# Patient Record
Sex: Female | Born: 1955 | Race: White | Hispanic: No | Marital: Married | Smoking: Never smoker
Health system: Southern US, Community
[De-identification: ages and names within clinical notes are randomized; demographics above are authoritative.]

## PROBLEM LIST (undated history)

## (undated) DIAGNOSIS — E119 Type 2 diabetes mellitus without complications: Secondary | ICD-10-CM

## (undated) DIAGNOSIS — E7800 Pure hypercholesterolemia, unspecified: Secondary | ICD-10-CM

## (undated) DIAGNOSIS — E079 Disorder of thyroid, unspecified: Secondary | ICD-10-CM

## (undated) HISTORY — PX: THROAT SURGERY: SHX803

## (undated) HISTORY — PX: REPLACEMENT TOTAL KNEE: SUR1224

---

## 1988-11-13 HISTORY — PX: BREAST BIOPSY: SHX20

## 2002-04-21 ENCOUNTER — Ambulatory Visit (HOSPITAL_COMMUNITY): Admission: RE | Admit: 2002-04-21 | Discharge: 2002-04-21 | Payer: Self-pay | Admitting: Gastroenterology

## 2005-02-21 ENCOUNTER — Ambulatory Visit: Payer: Self-pay | Admitting: Family Medicine

## 2006-04-27 ENCOUNTER — Ambulatory Visit: Payer: Self-pay | Admitting: Family Medicine

## 2007-07-04 ENCOUNTER — Ambulatory Visit: Payer: Self-pay | Admitting: Family Medicine

## 2007-11-20 ENCOUNTER — Inpatient Hospital Stay (HOSPITAL_COMMUNITY): Admission: RE | Admit: 2007-11-20 | Discharge: 2007-11-24 | Payer: Self-pay | Admitting: Orthopaedic Surgery

## 2008-09-17 ENCOUNTER — Ambulatory Visit: Payer: Self-pay | Admitting: Family Medicine

## 2009-09-30 ENCOUNTER — Ambulatory Visit: Payer: Self-pay | Admitting: Family Medicine

## 2010-10-11 ENCOUNTER — Ambulatory Visit: Payer: Self-pay | Admitting: Family Medicine

## 2011-01-06 ENCOUNTER — Ambulatory Visit: Payer: Self-pay | Admitting: Internal Medicine

## 2011-03-28 NOTE — Op Note (Signed)
NAMELOUINE, TENPENNY               ACCOUNT NO.:  [id number]   MEDICAL RECORD NO.:  [id number]          PATIENT TYPE:  INP   LOCATION:  2550                         FACILITY:  [facility]   PHYSICIAN:  Mark C. Ophelia Charter, M.D.    DATE OF BIRTH:  October 31, 1956   DATE OF PROCEDURE:  11/20/2007  DATE OF DISCHARGE:                               OPERATIVE REPORT   PREOPERATIVE DIAGNOSIS:  Right knee osteoarthritis.   POSTOPERATIVE DIAGNOSIS:  Right knee osteoarthritis.   PROCEDURE:  Right cemented total knee arthroplasty, computer assist.   SURGEON:  Mark C. Ophelia Charter, M.D.   ANESTHESIA:  GOT plus preoperative femoral nerve block plus Marcaine  local.   DRAINS:  One Hemovac.   SURGEON:  Mark C. Ophelia Charter, M.D.   ASSISTANT:  Wende Neighbors, P.A.-C.   TOURNIQUET TIME:  1 hour 13 minutes at 350.   PROCEDURE IN DETAIL:  After induction of general anesthesia and  orotracheal intubation, preoperative femoral nerve block, proximal thigh  tourniquet with lateral post and heel bump, careful padding, 1015 drape,  the leg was prepped with DuraPrep, usual total knee sheets, drapes,  impervious stockinette, Coban, sterile skin marker and Betadine Vidrape  was applied.  The time out procedure was taken following WHO new  standards.  Ancef was given and was complete prior to the incision.  The  leg was wrapped in an Esmarch, tourniquet inflated to 350 mmHg.  A  midline incision was made, superficial retinaculum was developed, true  retinaculum was divided with the medial parapatellar incisions between  the quad tendon between the medial 1/3 and lateral 2/3.  The patella was  flipped over, cut from facet to facet, removing 10 mm of bone.  The  retractor was placed and bone spurs removed.  There was tricompartmental  degenerative changes, marginal osteophytes, and some meniscal remnants.  The meniscus were resected.  Pins were placed for computer initiation  with two femoral pins inside the incision and then a 15  blade was used  for stab incision in the mid tibia for the two bicortical tibial pins.  Models were made of both the tibia and the femur.  The patient had 3.5  degrees of varus and had an almost 20 degree flexion contracture.  10-11  mm of bone were removed off both sides, posterior spurs were removed off  the femur.  All cuts were checked after cutting using the computer and  sizing was to a #3 and both flexion and extension gaps were within 0.5  mm.  Once keyholes were made in the tibia and all meniscal remnants had  been resected as well as ACL and PCL, pulsatile lavage was used.  The  trials were inserted.  The knee reached full extension.  The collateral  ligaments were balanced and spacer block gave full extension.  After  lavage, the tibia was cemented first followed by the femur, placement of  the 10 mm poly and then the 35 mm patella.  DePuy J&J rotating platform  knee was used, PFC, posterior cruciate substituting.  All excessive  cement was removed. After the cement  had hardened, the tourniquet was  deflated, hemostasis was obtained, and then layered closure.  During the  tibial resection, the oscillating saw caught part of the skin on the  medial aspect at the level of the joint line.  This gave a 1 to 1.5 cm  tear in the skin and, at the time of closure, some 3-0 Vicryl  subcuticulars were placed followed by some nylon sutures.  Subcuticular  on the rest of the incision closure. Postop dressing and a knee  immobilizer.  Instrument count and needle count was correct.      Mark C. Ophelia Charter, M.D.  Electronically Signed     MCY/MEDQ  D:  11/20/2007  T:  11/20/2007  Job:  [id number]

## 2011-03-31 NOTE — Discharge Summary (Signed)
[name], [name]               ACCOUNT NO.:  [id number]   MEDICAL RECORD NO.:  [id number]          PATIENT TYPE:  INP   LOCATION:  5001                         FACILITY:  [facility]   PHYSICIAN:  Mark C. Ophelia Charter, M.D.    DATE OF BIRTH:  05-05-1956   DATE OF ADMISSION:  11/20/2007  DATE OF DISCHARGE:  11/24/2007                               DISCHARGE SUMMARY   FINAL DIAGNOSIS:  Right knee osteoarthritis.   PROCEDURE:  Right total knee arthroplasty.   ADDITIONAL DIAGNOSES:  1. Obesity.  2. Esophageal reflux.   This 55 year old female has had right knee osteoarthritis with failure  to improve with progressive changes, bone-on-bone changes with marginal  osteophytes and recurrent effusion.  Failed conservative treatment,  including anti-inflammatories with recurrent cortisone injections.   ADMISSION MEDICATIONS:  1. Climara patch 0.5 daily.  2. Zoloft 100 mg p.o. daily.  3. Provera 2.5 mg daily.   HOSPITAL COURSE:  Patient was admitted.  Routine preoperative labs  showed a normal sinus rhythm EKG.   Hemoglobin was 13.2 preop and 11 postop.  PT/PTT were normal preop.  Chemistry panel was normal except for a glucose of 108.  Urinalysis was  normal.  A1C obtained postop was 6.1.   Patient was admitted and underwent total knee arthroplasty cemented on  the right, computer-assist, with satisfactory postop x-rays.  She was  placed on a 60 gm carb diet due to CBGs in the 160s.  Hemoglobin was  11.1 and stable postop.  She made slow progress with physical therapy  due to obesity, slow progress with knee flexion on CPM.   By discharge, she was ambulatory safely, had done stairs, was taking  Robaxin, iron, Colace, Tylox, and Coumadin.  The Coumadin was for three  weeks.  She had a bowel movement, and the incision was dry.  Also follow  up one post discharge.   CONDITION ON DISCHARGE:  Improved.      Mark C. Ophelia Charter, M.D.  Electronically Signed    MCY/MEDQ  D:  12/14/2007  T:   12/14/2007  Job:  [id number]

## 2011-08-03 LAB — DIFFERENTIAL
Basophils Absolute: 0
Basophils Relative: 1
Lymphocytes Relative: 30
Monocytes Absolute: 0.7
Neutro Abs: 4.4
Neutrophils Relative %: 57

## 2011-08-03 LAB — CBC
HCT: 30.1 — ABNORMAL LOW
HCT: 31.5 — ABNORMAL LOW
HCT: 38.9
Hemoglobin: 11.2 — ABNORMAL LOW
Hemoglobin: 13.2
MCHC: 34.4
MCV: 86.7
MCV: 87.3
Platelets: 200
Platelets: 204
Platelets: 207
Platelets: 257
RDW: 13.4
RDW: 13.5
RDW: 13.6
RDW: 13.7
WBC: 9.7

## 2011-08-03 LAB — COMPREHENSIVE METABOLIC PANEL
AST: 17
Albumin: 3.6
BUN: 16
CO2: 27
Calcium: 9.5
Chloride: 105
Creatinine, Ser: 0.65
GFR calc non Af Amer: >60
Total Bilirubin: 0.6

## 2011-08-03 LAB — URINALYSIS, ROUTINE W REFLEX MICROSCOPIC
Bilirubin Urine: NEGATIVE
Ketones, ur: NEGATIVE
Nitrite: NEGATIVE
Protein, ur: NEGATIVE
Urobilinogen, UA: 0.2

## 2011-08-03 LAB — BASIC METABOLIC PANEL
BUN: 10
Calcium: 8.4
Creatinine, Ser: 0.73
GFR calc non Af Amer: >60

## 2011-08-03 LAB — PROTIME-INR
INR: 0.8
INR: 1
INR: 1.3
Prothrombin Time: 13.6
Prothrombin Time: 23.2 — ABNORMAL HIGH
Prothrombin Time: 23.7 — ABNORMAL HIGH

## 2011-08-03 LAB — APTT: aPTT: 27

## 2011-08-03 LAB — HEMOGLOBIN A1C

## 2011-10-19 ENCOUNTER — Ambulatory Visit: Payer: Self-pay | Admitting: Family Medicine

## 2012-09-04 ENCOUNTER — Ambulatory Visit: Payer: Self-pay | Admitting: Family Medicine

## 2012-10-22 ENCOUNTER — Ambulatory Visit: Payer: Self-pay | Admitting: Family Medicine

## 2013-07-17 ENCOUNTER — Ambulatory Visit: Payer: Self-pay | Admitting: Emergency Medicine

## 2013-07-19 ENCOUNTER — Ambulatory Visit: Payer: Self-pay | Admitting: Emergency Medicine

## 2014-02-21 ENCOUNTER — Ambulatory Visit: Payer: Self-pay | Admitting: Physician Assistant

## 2014-05-05 ENCOUNTER — Ambulatory Visit: Payer: Self-pay | Admitting: Family Medicine

## 2015-09-07 ENCOUNTER — Other Ambulatory Visit: Payer: Self-pay | Admitting: Family Medicine

## 2015-09-07 DIAGNOSIS — Z1231 Encounter for screening mammogram for malignant neoplasm of breast: Secondary | ICD-10-CM

## 2015-09-28 ENCOUNTER — Ambulatory Visit: Payer: Self-pay

## 2016-11-28 ENCOUNTER — Other Ambulatory Visit: Payer: Self-pay | Admitting: Family Medicine

## 2016-11-28 DIAGNOSIS — Z1239 Encounter for other screening for malignant neoplasm of breast: Secondary | ICD-10-CM

## 2018-01-29 ENCOUNTER — Other Ambulatory Visit: Payer: Self-pay | Admitting: Family Medicine

## 2018-01-29 DIAGNOSIS — Z1231 Encounter for screening mammogram for malignant neoplasm of breast: Secondary | ICD-10-CM

## 2020-03-29 ENCOUNTER — Other Ambulatory Visit: Payer: Self-pay

## 2020-03-29 ENCOUNTER — Ambulatory Visit
Admission: EM | Admit: 2020-03-29 | Discharge: 2020-03-29 | Disposition: A | Discharge status: Home / Self Care | Payer: Medicare Other | Attending: Family Medicine | Admitting: Family Medicine

## 2020-03-29 DIAGNOSIS — I878 Other specified disorders of veins: Secondary | ICD-10-CM | POA: Diagnosis not present

## 2020-03-29 DIAGNOSIS — S81809A Unspecified open wound, unspecified lower leg, initial encounter: Secondary | ICD-10-CM

## 2020-03-29 HISTORY — DX: Type 2 diabetes mellitus without complications: E11.9

## 2020-03-29 HISTORY — DX: Disorder of thyroid, unspecified: E07.9

## 2020-03-29 HISTORY — DX: Pure hypercholesterolemia, unspecified: E78.00

## 2020-03-29 MED ORDER — DOXYCYCLINE HYCLATE 100 MG PO CAPS: 100.0000 mg | ORAL_CAPSULE | Freq: Two times a day (BID) | ORAL | 0 refills | Status: AC

## 2020-03-29 NOTE — ED Provider Notes (Signed)
[facility]    CSN: [id number] Arrival date & time: 03/29/20  1435  History   Chief Complaint Chief Complaint  Patient presents with  . Recurrent Skin Infections   HPI  64 year old female presents with open wounds on her lower extremities.  Patient has severe swelling.  She is unsure of how long this has been going on.  She states that it comes and goes.  Seems to get worse when she is sitting for prolonged periods of time and gets better when she elevates her legs.  She has previously been prescribed Lasix but does not take it regularly.  Patient reports that she has had open wounds on her lower extremities, right greater than left for the past several weeks.  She is unsure the exact duration.  Wounds are draining.  She is concerned about infection.  She reports associated pain, 5/10 in severity.  No documented fever.  No relieving factors.  No other complaints.  Past Medical History:  Diagnosis Date  . Diabetes mellitus without complication (HCC)   . High cholesterol   . Thyroid disease    Past Surgical History:  Procedure Laterality Date  . REPLACEMENT TOTAL KNEE    . THROAT SURGERY     OB History   No obstetric history on file.    Home Medications    Prior to Admission medications   Medication Sig Start Date End Date Taking? Authorizing Provider  ALPRAZolam Prudy Feeler) 0.5 MG tablet Take by mouth. 07/29/13  Yes [provider]  aspirin 81 MG EC tablet Take by mouth. 11/26/17  Yes [provider]  furosemide (LASIX) 20 MG tablet Take by mouth. 07/02/18  Yes [provider]  levothyroxine (SYNTHROID) 75 MCG tablet Take by mouth. 11/11/19 11/10/20 Yes [provider]  metFORMIN (GLUCOPHAGE) 500 MG tablet Take by mouth. 11/06/19  Yes [provider]  metoprolol succinate (TOPROL-XL) 50 MG 24 hr tablet TAKE 1 TABLET BY MOUTH EVERY DAY 11/05/19  Yes [provider]  mupirocin ointment (BACTROBAN) 2 % Apply topically.  09/30/19  Yes [provider]  rosuvastatin (CRESTOR) 10 MG tablet Take by mouth. 09/30/19  Yes [provider]  sertraline (ZOLOFT) 100 MG tablet Take by mouth. 02/05/20  Yes [provider]  doxycycline (VIBRAMYCIN) 100 MG capsule Take 1 capsule (100 mg total) by mouth 2 (two) times daily. 03/29/20   Tommie Sams, DO   Social History Social History   Tobacco Use  . Smoking status: Never Smoker  . Smokeless tobacco: Never Used  Substance Use Topics  . Alcohol use: Yes    Comment: rare  . Drug use: Never     Allergies   Patient has no known allergies.   Review of Systems Review of Systems  Cardiovascular: Positive for leg swelling.  Skin: Positive for wound.   Physical Exam Triage Vital Signs ED Triage Vitals  Enc Vitals Group     BP 03/29/20 1523 (!) 148/68     Pulse Rate 03/29/20 1523 80     Resp 03/29/20 1523 16     Temp 03/29/20 1523 (!) 97.4 F (36.3 C)     Temp Source 03/29/20 1523 Oral     SpO2 03/29/20 1523 98 %     Weight 03/29/20 1524 265 lb (120.2 kg)     Height 03/29/20 1524 5' 4.5" (1.638 m)     Head Circumference --      Peak Flow --      Pain Score 03/29/20  1521 5     Pain Loc --      Pain Edu? --      Excl. in [place]? --    Updated Vital Signs BP (!) 148/68 (BP Location: Right Arm)   Pulse 80   Temp (!) 97.4 F (36.3 C) (Oral)   Resp 16   Ht 5' 4.5" (1.638 m)   Wt 120.2 kg   SpO2 98%   BMI 44.78 kg/m   Visual Acuity Right Eye Distance:   Left Eye Distance:   Bilateral Distance:    Right Eye Near:   Left Eye Near:    Bilateral Near:     Physical Exam Vitals and nursing note reviewed.  Constitutional:      General: She is not in acute distress.    Appearance: Normal appearance. She is obese. She is not ill-appearing.  HENT:     Head: Normocephalic and atraumatic.  Eyes:     General:        Right eye: No discharge.        Left eye: No discharge.     Conjunctiva/sclera: Conjunctivae normal.  Pulmonary:      Effort: Pulmonary effort is normal. No respiratory distress.  Musculoskeletal:     Comments: Bilateral lower extremity edema from the knee down, 3+ pitting edema.  Evidence of chronic venous insufficiency noted with chronic skin changes.  Dry skin noted.  Patient has several open wounds on the right lower extremity and also has a wound on the left lower extremity.  Wounds on the right lower extremity are draining serous fluid.  Neurological:     Mental Status: She is alert.  Psychiatric:        Mood and Affect: Mood normal.        Behavior: Behavior normal.    UC Treatments / Results  Labs (all labs ordered are listed, but only abnormal results are displayed) Labs Reviewed - No data to display  EKG   Radiology No results found.  Procedures Procedures (including critical care time)  Medications Ordered in UC Medications - No data to display  Initial Impression / Assessment and Plan / UC Course  I have reviewed the triage vital signs and the nursing notes.  Pertinent labs & imaging results that were available during my care of the patient were reviewed by me and considered in my medical decision making (see chart for details).    64 year old female presents with severe venous stasis and open wounds on the lower extremities.  Placing on doxycycline.  Advised elevation.  Unlikely to be able to use compression stockings at this time.  Advise follow-up with PCP.  Likely needs referral to wound care and needs Unna boots.  Final Clinical Impressions(s) / UC Diagnoses   Final diagnoses:  Venous stasis  Multiple opens wound of lower extremity, unspecified laterality, initial encounter     Discharge Instructions     Keep legs elevated.  Antibiotics as prescribed.  See PCP as soon as possible. Likely need referral to wound care for [employer].  Take care  Dr. Lacinda Axon    ED Prescriptions    Medication Sig Dispense Auth. Provider   doxycycline (VIBRAMYCIN) 100 MG capsule  Take 1 capsule (100 mg total) by mouth 2 (two) times daily. 14 capsule Thersa Salt G, DO     PDMP not reviewed this encounter.   Coral Spikes, [place] 03/29/20 1741

## 2020-03-29 NOTE — Discharge Instructions (Signed)
Keep legs elevated.  Antibiotics as prescribed.  See PCP as soon as possible. Likely need referral to wound care for [employer].  Take care  Dr. Adriana Simas

## 2020-03-29 NOTE — ED Triage Notes (Addendum)
Pt reports she has been scratching her lower legs and now has "infected wounds"  to both legs.

## 2020-08-18 ENCOUNTER — Other Ambulatory Visit: Payer: Self-pay | Admitting: Family Medicine

## 2020-08-18 DIAGNOSIS — Z1231 Encounter for screening mammogram for malignant neoplasm of breast: Secondary | ICD-10-CM

## 2020-08-30 ENCOUNTER — Other Ambulatory Visit: Payer: Self-pay

## 2020-08-30 ENCOUNTER — Ambulatory Visit
Admission: RE | Admit: 2020-08-30 | Discharge: 2020-08-30 | Disposition: A | Discharge status: Home / Self Care | Payer: Medicare Other | Source: Ambulatory Visit | Attending: Family Medicine | Admitting: Family Medicine

## 2020-08-30 ENCOUNTER — Encounter (INDEPENDENT_AMBULATORY_CARE_PROVIDER_SITE_OTHER): Payer: Self-pay

## 2020-08-30 DIAGNOSIS — Z1231 Encounter for screening mammogram for malignant neoplasm of breast: Secondary | ICD-10-CM | POA: Insufficient documentation

## 2021-12-22 ENCOUNTER — Other Ambulatory Visit: Payer: Self-pay | Admitting: Family Medicine

## 2021-12-22 DIAGNOSIS — Z780 Asymptomatic menopausal state: Secondary | ICD-10-CM

## 2021-12-23 ENCOUNTER — Other Ambulatory Visit: Payer: Self-pay | Admitting: Family Medicine

## 2021-12-23 DIAGNOSIS — Z1231 Encounter for screening mammogram for malignant neoplasm of breast: Secondary | ICD-10-CM

## 2022-01-29 IMAGING — MG DIGITAL SCREENING BILAT W/ TOMO W/ CAD
8 series · 8 of 24 positions shown · non-contrast
Comparison: Previous exam(s).

CLINICAL DATA: Screening.

EXAM:
DIGITAL SCREENING BILATERAL MAMMOGRAM WITH TOMO AND CAD

[R MLO synth-2D]
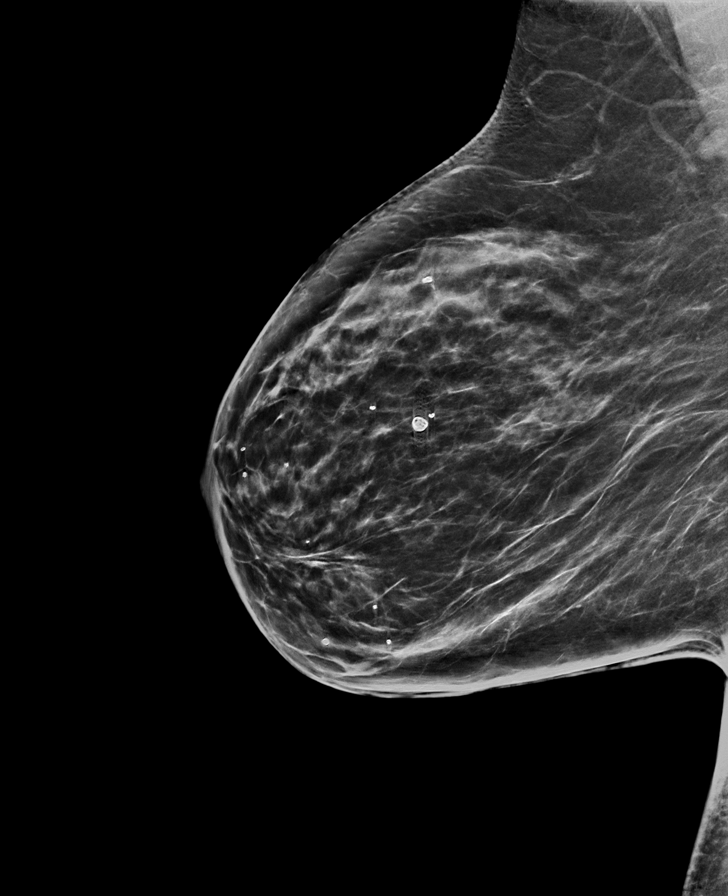

[R CC synth-2D]
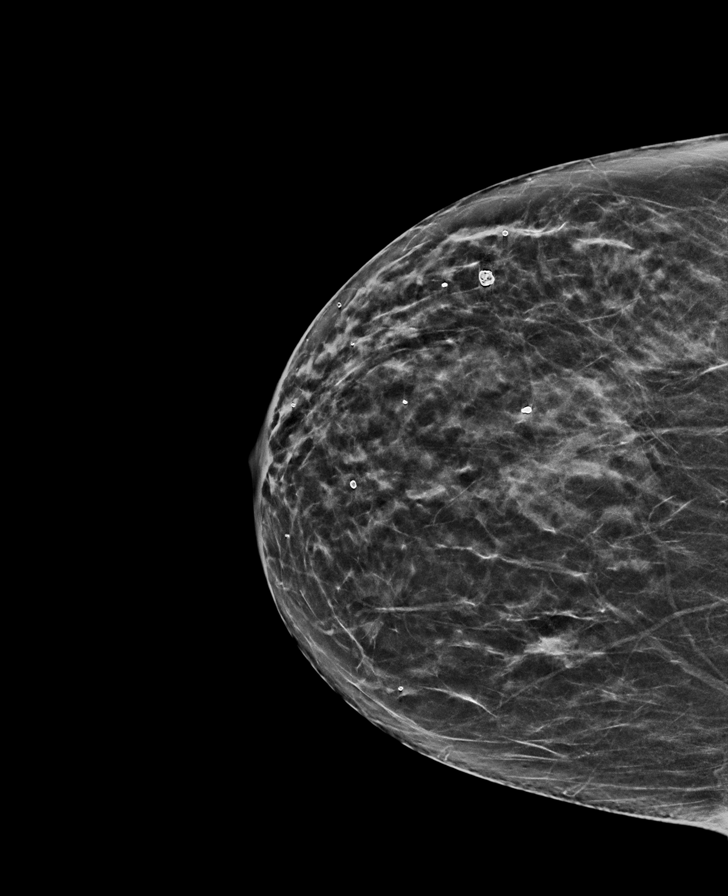

[L MLO synth-2D]
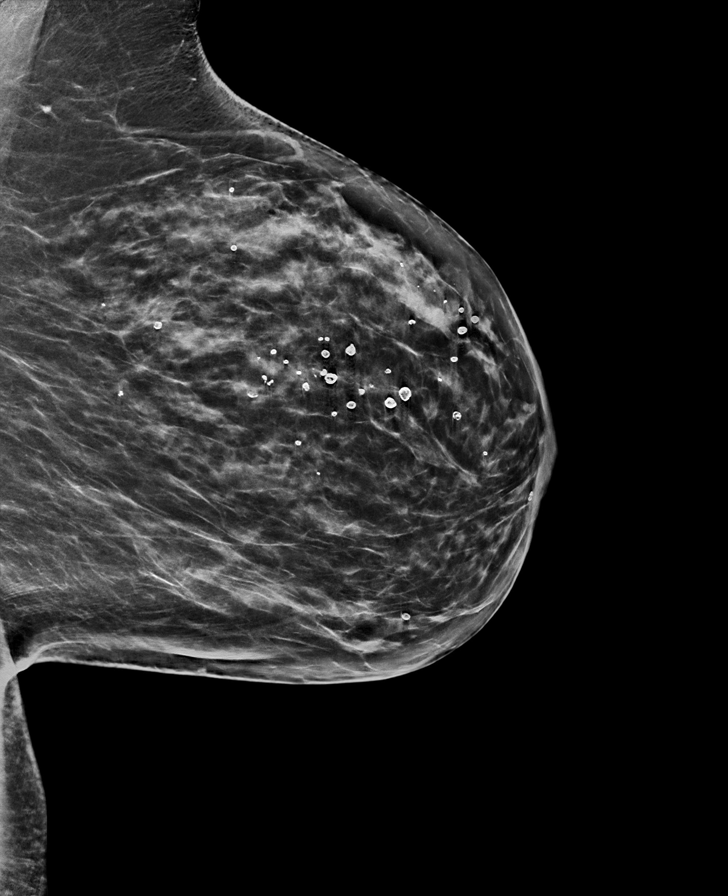

[L CC synth-2D]
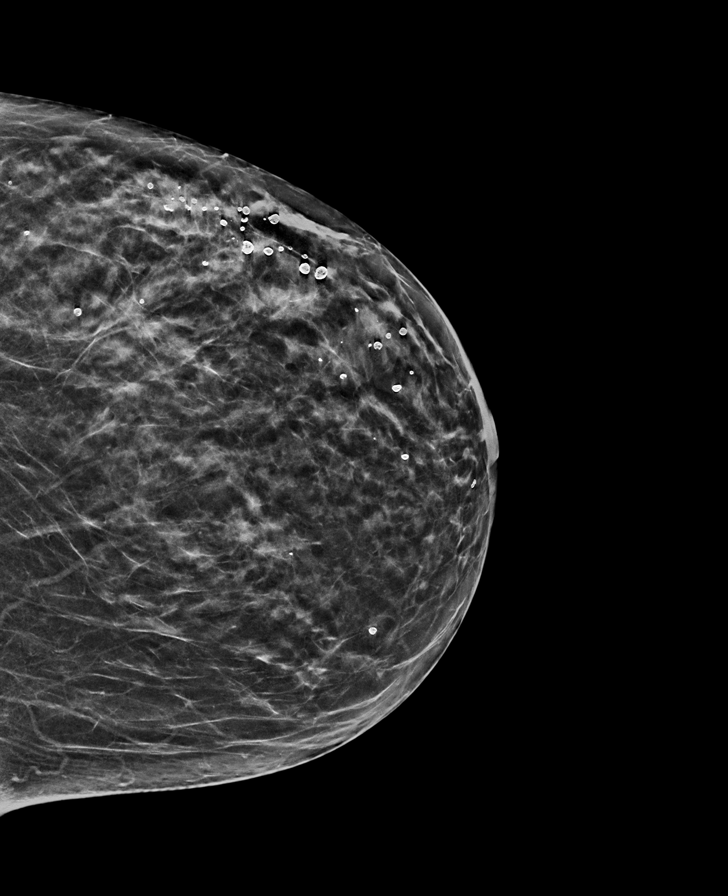

[L MLO tomo · tomo slice 39/77.0]
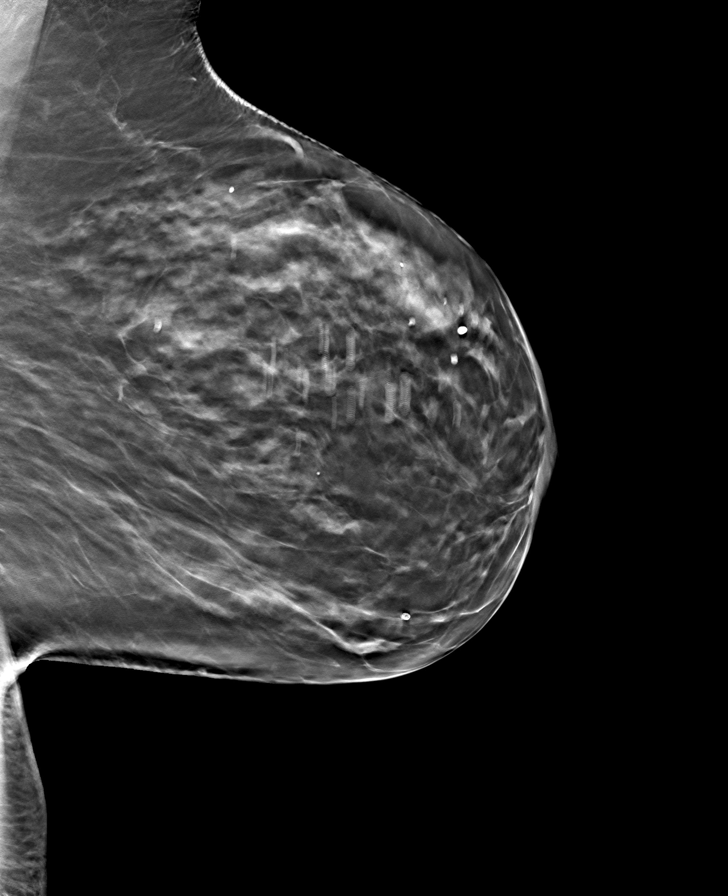

[L CC tomo · tomo slice 32/63.0]
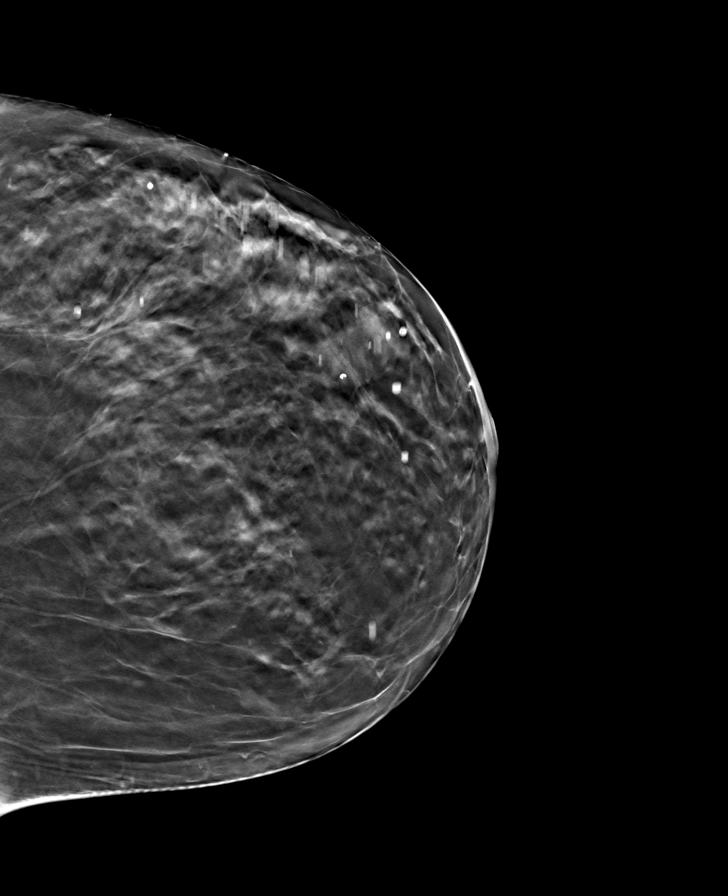

[R CC tomo · tomo slice 31/61.0]
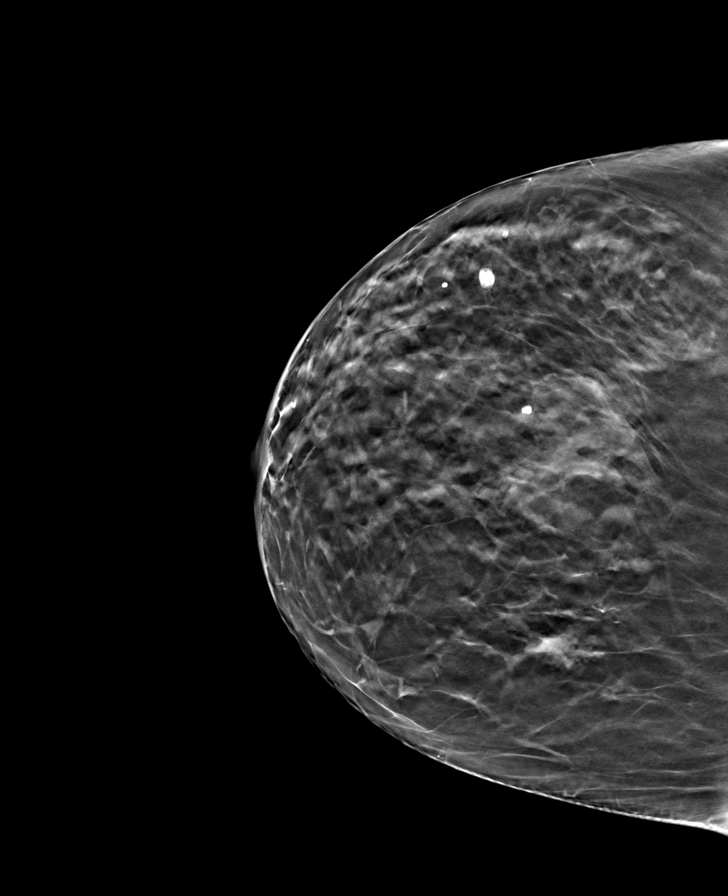

[R MLO tomo · tomo slice 38/75.0]
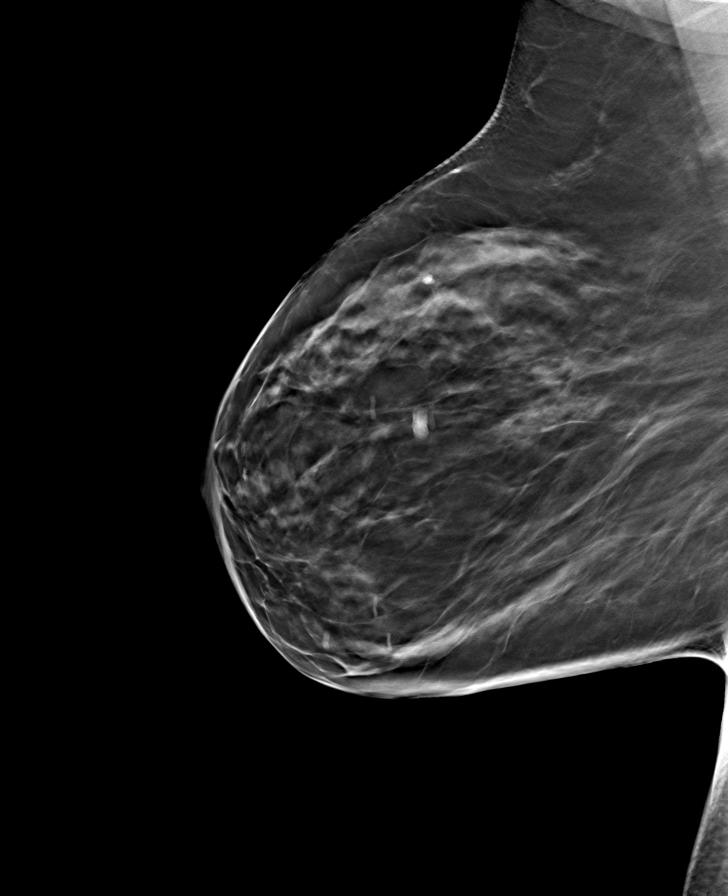

[8 of 24 positions shown; findings below may reference images not displayed]

ACR Breast Density Category c: The breast tissue is heterogeneously
dense, which may obscure small masses.
FINDINGS: There are no findings suspicious for malignancy. Images were
processed with CAD.
IMPRESSION: No mammographic evidence of malignancy. A result letter of this
screening mammogram will be mailed directly to the patient.

RECOMMENDATION:
Screening mammogram in one year. (Code:FT-U-LHB)

BI-RADS CATEGORY  1: Negative.

## 2022-02-16 ENCOUNTER — Ambulatory Visit
Admission: RE | Admit: 2022-02-16 | Discharge: 2022-02-16 | Disposition: A | Discharge status: Home / Self Care | Payer: Medicare Other | Source: Ambulatory Visit | Attending: Family Medicine | Admitting: Family Medicine

## 2022-02-16 DIAGNOSIS — Z1231 Encounter for screening mammogram for malignant neoplasm of breast: Secondary | ICD-10-CM | POA: Diagnosis present

## 2022-02-16 DIAGNOSIS — Z780 Asymptomatic menopausal state: Secondary | ICD-10-CM | POA: Diagnosis present

## 2023-02-28 ENCOUNTER — Other Ambulatory Visit: Payer: Self-pay | Admitting: Unknown Physician Specialty

## 2023-02-28 DIAGNOSIS — Z1231 Encounter for screening mammogram for malignant neoplasm of breast: Secondary | ICD-10-CM

## 2023-05-07 ENCOUNTER — Ambulatory Visit
Admission: RE | Admit: 2023-05-07 | Discharge: 2023-05-07 | Disposition: A | Discharge status: Home / Self Care | Payer: Medicare Other | Source: Ambulatory Visit | Attending: Unknown Physician Specialty | Admitting: Unknown Physician Specialty

## 2023-05-07 DIAGNOSIS — Z1231 Encounter for screening mammogram for malignant neoplasm of breast: Secondary | ICD-10-CM | POA: Diagnosis present
